# Patient Record
Sex: Male | Born: 1995 | Race: Black or African American | Hispanic: No | Marital: Single | Smoking: Never smoker
Health system: Southern US, Community
[De-identification: ages and names within clinical notes are randomized; demographics above are authoritative.]

## PROBLEM LIST (undated history)

## (undated) DIAGNOSIS — Z789 Other specified health status: Secondary | ICD-10-CM

## (undated) HISTORY — PX: NO PAST SURGERIES: SHX2092

---
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
## 2013-12-28 ENCOUNTER — Observation Stay (HOSPITAL_COMMUNITY)
Admission: EM | Admit: 2013-12-28 | Discharge: 2013-12-28 | Disposition: A | Discharge status: Home / Self Care | Payer: Self-pay | Attending: General Surgery | Admitting: General Surgery

## 2013-12-28 ENCOUNTER — Emergency Department (HOSPITAL_COMMUNITY): Payer: Self-pay

## 2013-12-28 ENCOUNTER — Observation Stay (HOSPITAL_COMMUNITY): Payer: Self-pay

## 2013-12-28 ENCOUNTER — Encounter (HOSPITAL_COMMUNITY): Payer: Self-pay | Admitting: Radiology

## 2013-12-28 DIAGNOSIS — S31809A Unspecified open wound of unspecified buttock, initial encounter: Principal | ICD-10-CM | POA: Insufficient documentation

## 2013-12-28 DIAGNOSIS — W3400XA Accidental discharge from unspecified firearms or gun, initial encounter: Secondary | ICD-10-CM

## 2013-12-28 DIAGNOSIS — S71109A Unspecified open wound, unspecified thigh, initial encounter: Secondary | ICD-10-CM | POA: Insufficient documentation

## 2013-12-28 DIAGNOSIS — S71009A Unspecified open wound, unspecified hip, initial encounter: Secondary | ICD-10-CM | POA: Insufficient documentation

## 2013-12-28 LAB — POCT I-STAT, CHEM 8
BUN: 14 mg/dL (ref 6–23)
CALCIUM ION: 1.18 mmol/L (ref 1.12–1.23)
CREATININE: 1.1 mg/dL (ref 0.50–1.35)
Chloride: 100 mEq/L (ref 96–112)
Glucose, Bld: 94 mg/dL (ref 70–99)
HCT: 49 % (ref 39.0–52.0)
HEMOGLOBIN: 16.7 g/dL (ref 13.0–17.0)
POTASSIUM: 3.9 meq/L (ref 3.7–5.3)
Sodium: 138 mEq/L (ref 137–147)
TCO2: 27 mmol/L (ref 0–100)

## 2013-12-28 LAB — COMPREHENSIVE METABOLIC PANEL
ALBUMIN: 4 g/dL (ref 3.5–5.2)
ALT: 12 U/L (ref 0–53)
AST: 25 U/L (ref 0–37)
Alkaline Phosphatase: 52 U/L (ref 39–117)
BUN: 13 mg/dL (ref 6–23)
CHLORIDE: 99 meq/L (ref 96–112)
CO2: 23 mEq/L (ref 19–32)
CREATININE: 0.96 mg/dL (ref 0.50–1.35)
Calcium: 9.1 mg/dL (ref 8.4–10.5)
GFR calc Af Amer: >90 mL/min (ref >90)
Glucose, Bld: 96 mg/dL (ref 70–99)
Potassium: 4.1 mEq/L (ref 3.7–5.3)
Sodium: 137 mEq/L (ref 137–147)
Total Bilirubin: 0.6 mg/dL (ref 0.3–1.2)
Total Protein: 7.6 g/dL (ref 6.0–8.3)

## 2013-12-28 LAB — PROTIME-INR
INR: 0.97 (ref 0.00–1.49)
PROTHROMBIN TIME: 12.7 s (ref 11.6–15.2)

## 2013-12-28 LAB — URINALYSIS, ROUTINE W REFLEX MICROSCOPIC
Bilirubin Urine: NEGATIVE
GLUCOSE, UA: NEGATIVE mg/dL
Hgb urine dipstick: NEGATIVE
Ketones, ur: NEGATIVE mg/dL
Leukocytes, UA: NEGATIVE
NITRITE: NEGATIVE
PROTEIN: NEGATIVE mg/dL
Specific Gravity, Urine: 1.046 — ABNORMAL HIGH (ref 1.005–1.030)
Urobilinogen, UA: 1 mg/dL (ref 0.0–1.0)
pH: 6.5 (ref 5.0–8.0)

## 2013-12-28 LAB — TYPE AND SCREEN
ABO/RH(D): A POS
Antibody Screen: NEGATIVE
UNIT DIVISION: 0
Unit division: 0

## 2013-12-28 LAB — CDS SEROLOGY

## 2013-12-28 LAB — CBC
HCT: 43 % (ref 39.0–52.0)
Hemoglobin: 14.4 g/dL (ref 13.0–17.0)
MCH: 23.5 pg — ABNORMAL LOW (ref 26.0–34.0)
MCHC: 33.5 g/dL (ref 30.0–36.0)
MCV: 70.3 fL — ABNORMAL LOW (ref 78.0–100.0)
PLATELETS: 205 10*3/uL (ref 150–400)
RBC: 6.12 MIL/uL — AB (ref 4.22–5.81)
RDW: 15.6 % — AB (ref 11.5–15.5)
WBC: 12.5 10*3/uL — AB (ref 4.0–10.5)

## 2013-12-28 LAB — CG4 I-STAT (LACTIC ACID): Lactic Acid, Venous: 3.42 mmol/L — ABNORMAL HIGH (ref 0.5–2.2)

## 2013-12-28 LAB — ABO/RH: ABO/RH(D): A POS

## 2013-12-28 MED ORDER — FENTANYL CITRATE 0.05 MG/ML IJ SOLN
50.0000 ug | Freq: Once | INTRAMUSCULAR | Status: AC
  Administered 2013-12-28: 50 ug via INTRAVENOUS

## 2013-12-28 MED ORDER — DEXTROSE 5 % IV SOLN
2.0000 g | Freq: Once | INTRAVENOUS | Status: AC
  Administered 2013-12-28: 2 g via INTRAVENOUS
  Filled 2013-12-28: qty 20

## 2013-12-28 MED ORDER — FENTANYL CITRATE 0.05 MG/ML IJ SOLN
INTRAMUSCULAR | Status: AC
  Administered 2013-12-28: 50 ug
  Filled 2013-12-28: qty 2

## 2013-12-28 MED ORDER — IOHEXOL 300 MG/ML  SOLN
100.0000 mL | Freq: Once | INTRAMUSCULAR | Status: AC | PRN
  Administered 2013-12-28: 100 mL via INTRAVENOUS
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
## 2013-12-28 NOTE — ED Notes (Signed)
Attempt made to obtain urine from patient. He states he will be able to urinate in a little bit.

## 2013-12-28 NOTE — ED Notes (Signed)
Pt.s belongings placed in brown paper bags, closed and dated.
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
## 2013-12-28 NOTE — ED Notes (Signed)
CSI at bedside.

## 2013-12-28 NOTE — ED Notes (Signed)
Pt. Transferred  To CT scan 2, accompanied by RN and Dr. [name]

## 2013-12-28 NOTE — ED Notes (Signed)
Paging Dr. [name], patient states he does not want to be admitted and he wants to go home.

## 2013-12-28 NOTE — Discharge Instructions (Signed)
Per Dr. [name] keep wounds dry and covered with gauze. If any complications or questions call the Trauma office at [phone].
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
## 2013-12-28 NOTE — ED Notes (Signed)
4x4's and 2x2's applied to patients wounds and secured with tape. Patient given crutches with instructions on how to use.

## 2013-12-28 NOTE — ED Notes (Signed)
Pt. Is aware of needing a urine specimen 
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
## 2013-12-28 NOTE — ED Notes (Signed)
Per Dr. [name] patient is able to be discharged home with crutches. Dr. [name] is on his way to see patient at this time.
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
## 2013-12-28 NOTE — ED Notes (Signed)
Patient states that he is considering on just getting some crutches and going home tonight. Patient states that Dr. [name] gave him this option.

## 2013-12-28 NOTE — ED Notes (Signed)
Family for pt continues to come to desk wanting to go back and see pt.  Family informed that [employer] states family must remain out of room until their work is finished.  Family accepts this explaination and politely waiting in lobby.

## 2013-12-28 NOTE — ED Notes (Signed)
Pt. Returned from CT scan.

## 2014-03-01 ENCOUNTER — Emergency Department (HOSPITAL_COMMUNITY): Payer: Self-pay

## 2014-03-01 ENCOUNTER — Emergency Department (HOSPITAL_COMMUNITY)
Admission: EM | Admit: 2014-03-01 | Discharge: 2014-03-01 | Discharge status: Against Medical Advice | Payer: Self-pay | Attending: Emergency Medicine | Admitting: Emergency Medicine

## 2014-03-01 ENCOUNTER — Encounter (HOSPITAL_COMMUNITY): Payer: Self-pay | Admitting: Emergency Medicine

## 2014-03-01 DIAGNOSIS — F1210 Cannabis abuse, uncomplicated: Secondary | ICD-10-CM | POA: Insufficient documentation

## 2014-03-01 DIAGNOSIS — R413 Other amnesia: Secondary | ICD-10-CM | POA: Insufficient documentation

## 2014-03-01 LAB — COMPREHENSIVE METABOLIC PANEL
ALBUMIN: 4 g/dL (ref 3.5–5.2)
ALT: 12 U/L (ref 0–53)
AST: 19 U/L (ref 0–37)
Alkaline Phosphatase: 55 U/L (ref 39–117)
BILIRUBIN TOTAL: 0.8 mg/dL (ref 0.3–1.2)
BUN: 9 mg/dL (ref 6–23)
CHLORIDE: 99 meq/L (ref 96–112)
CO2: 27 mEq/L (ref 19–32)
Calcium: 9.5 mg/dL (ref 8.4–10.5)
Creatinine, Ser: 0.91 mg/dL (ref 0.50–1.35)
GFR calc Af Amer: >90 mL/min (ref >90)
Glucose, Bld: 104 mg/dL — ABNORMAL HIGH (ref 70–99)
Potassium: 3.7 mEq/L (ref 3.7–5.3)
SODIUM: 140 meq/L (ref 137–147)
Total Protein: 7.5 g/dL (ref 6.0–8.3)

## 2014-03-01 LAB — RAPID URINE DRUG SCREEN, HOSP PERFORMED
Amphetamines: NOT DETECTED
BARBITURATES: NOT DETECTED
Benzodiazepines: NOT DETECTED
Cocaine: NOT DETECTED
Opiates: NOT DETECTED
TETRAHYDROCANNABINOL: POSITIVE — AB

## 2014-03-01 LAB — CBC
HCT: 44.9 % (ref 39.0–52.0)
Hemoglobin: 15 g/dL (ref 13.0–17.0)
MCH: 23.8 pg — ABNORMAL LOW (ref 26.0–34.0)
MCHC: 33.4 g/dL (ref 30.0–36.0)
MCV: 71.2 fL — ABNORMAL LOW (ref 78.0–100.0)
PLATELETS: 164 10*3/uL (ref 150–400)
RBC: 6.31 MIL/uL — ABNORMAL HIGH (ref 4.22–5.81)
RDW: 15.5 % (ref 11.5–15.5)
WBC: 8.6 10*3/uL (ref 4.0–10.5)

## 2014-03-01 LAB — CBG MONITORING, ED: Glucose-Capillary: 109 mg/dL — ABNORMAL HIGH (ref 70–99)

## 2014-03-01 LAB — ETHANOL

## 2014-03-01 LAB — AMMONIA: Ammonia: 24 umol/L (ref 11–60)

## 2014-03-01 NOTE — ED Notes (Signed)
PtA&Ox4,   Ambulatory at discharge, verbalizing no complaints at this time.
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
## 2014-03-01 NOTE — ED Notes (Signed)
Pts father approached Marlon Peliffany Greene, [place] and would like for patient to be discharged. Tiffany, PA advised patient's father that the patient would need to remain in his sight and care and [facility] and for the patient to remain under his care/sight until he takes the patient to behavioral health. Pts father agrees and states that the patient would stay at his house and be under his care until his [facility] and father states he would take the patient to behavioral health tomorrow morning and reports he has the resources [facility]. Tiffany, PA agrees to discharge the patient tonight and father agrees to have patient remain under his care until patient is taken for evaluation at behavioral health.
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
## 2014-03-02 ENCOUNTER — Ambulatory Visit (HOSPITAL_COMMUNITY)
Admission: RE | Admit: 2014-03-02 | Discharge: 2014-03-02 | Disposition: A | Discharge status: Home / Self Care | Payer: Self-pay | Attending: Psychiatry | Admitting: Psychiatry

## 2014-03-02 ENCOUNTER — Encounter (HOSPITAL_COMMUNITY): Payer: Self-pay | Admitting: *Deleted

## 2014-03-02 HISTORY — DX: Other specified health status: Z78.9

## 2014-03-02 LAB — CBG MONITORING, ED: GLUCOSE-CAPILLARY: 77 mg/dL (ref 70–99)
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
## 2014-03-02 NOTE — ED Provider Notes (Signed)
Medical screening examination/treatment/procedure(s) were conducted as a shared visit with non-physician practitioner(s) and myself.  I personally evaluated the patient during the encounter.  Patient states confusion and memory loss since this morning. Had to be told who is parents were.  Denies any trauma or drug use. No psych history. Alert and oriented x3 on my exam, no meningismus, no fever, no neuro deficits. No headache. No neck pain or stiffness. CT head negative except for mucocele.  THC positive. Not suicidal or homicidal.   Patient and family wished to leave before evaluation was complete. They think etiology is psych related which may be correct.  Patient is oriented x3, no SI or HI and appears to have capacity to leave against medical advice. Patient and family understand that life threatening conditions have not been completely ruled out and there is potential for possible deterioration and possible death.  They understand these risks and will leave AMA.   EKG Interpretation None       Jose OctaveStephen Ryatt Corsino, MD 03/02/14 1049

## 2014-10-23 ENCOUNTER — Encounter (HOSPITAL_COMMUNITY): Payer: Self-pay | Admitting: Emergency Medicine

## 2014-10-23 ENCOUNTER — Emergency Department (HOSPITAL_COMMUNITY): Payer: Self-pay

## 2014-10-23 ENCOUNTER — Emergency Department (HOSPITAL_COMMUNITY)
Admission: EM | Admit: 2014-10-23 | Discharge: 2014-10-23 | Disposition: A | Discharge status: Home / Self Care | Payer: Self-pay | Attending: Emergency Medicine | Admitting: Emergency Medicine

## 2014-10-23 DIAGNOSIS — T1490XA Injury, unspecified, initial encounter: Secondary | ICD-10-CM

## 2014-10-23 DIAGNOSIS — W2201XA Walked into wall, initial encounter: Secondary | ICD-10-CM | POA: Insufficient documentation

## 2014-10-23 DIAGNOSIS — Y9389 Activity, other specified: Secondary | ICD-10-CM | POA: Insufficient documentation

## 2014-10-23 DIAGNOSIS — S62613A Displaced fracture of proximal phalanx of left middle finger, initial encounter for closed fracture: Secondary | ICD-10-CM | POA: Insufficient documentation

## 2014-10-23 DIAGNOSIS — Y998 Other external cause status: Secondary | ICD-10-CM | POA: Insufficient documentation

## 2014-10-23 DIAGNOSIS — S62619A Displaced fracture of proximal phalanx of unspecified finger, initial encounter for closed fracture: Secondary | ICD-10-CM

## 2014-10-23 DIAGNOSIS — Y9289 Other specified places as the place of occurrence of the external cause: Secondary | ICD-10-CM | POA: Insufficient documentation

## 2014-10-23 MED ORDER — NAPROXEN 500 MG PO TABS: 500.0000 mg | ORAL_TABLET | Freq: Two times a day (BID) | ORAL | Status: DC

## 2014-10-23 MED ORDER — TRAMADOL HCL 50 MG PO TABS: 50.0000 mg | ORAL_TABLET | Freq: Four times a day (QID) | ORAL | Status: DC | PRN

## 2014-10-23 MED ORDER — OXYCODONE-ACETAMINOPHEN 5-325 MG PO TABS
1.0000 | ORAL_TABLET | Freq: Once | ORAL | Status: AC
  Administered 2014-10-23: 1 via ORAL
  Filled 2014-10-23: qty 1

## 2014-10-23 NOTE — ED Notes (Signed)
Splint applied by ortho tech

## 2014-10-23 NOTE — ED Notes (Signed)
Ortho tech paged  

## 2014-10-23 NOTE — Progress Notes (Signed)
Orthopedic Tech Progress Note Patient Details:  Michele McalpineKivaughn Ellsworth 11/07/1996 [id number] Applied fiberglass volar arm splint to LUE.  Pulses, sensation, motion intact before and after application.  Capillary refill less than 2 seconds before and after application. Ortho Devices Type of Ortho Device: Volar splint Ortho Device/Splint Location: LUE Ortho Device/Splint Interventions: Application   Lesle ChrisGilliland, Jisell Majer L 10/23/2014, 11:31 PM

## 2014-10-23 NOTE — ED Notes (Signed)
Ortho paged. 

## 2014-10-23 NOTE — ED Notes (Signed)
Ortho tech at the bedside.  

## 2014-10-23 NOTE — ED Notes (Signed)
Ortho tech coming to place slint
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
## 2014-10-23 NOTE — ED Notes (Signed)
Pt. punched a wall yesterday , presents with left hand pain / swelling .

## 2014-10-23 NOTE — Discharge Instructions (Signed)
Naprosyn for pain. Ultram for severe pain. Follow up with hand specialist as referred. Keep hand up elevated, ice.   Avulsion Fracture You have an avulsion fracture. Avulsion fractures are chips of bone pulled off by muscle tendons or ligaments. Common avulsion fractures are on the hand and foot. Avulsion fractures can also involve the elbow, knee, hip, and pelvis. The diagnosis is usually made by X-ray or ultrasound exam. These fractures may cause a deformity if the growth plate of the bone is involved in growing children. A growth plate is an area near the end of the bone where the bone grows from. Avulsion fractures can take several weeks to heal. They need long-term protection and follow-up. Do not remove the splint, immobilizer, or cast that has been applied to treat your injury unless instructed to do so. This is the most important part of your treatment. Other measures for treating avulsion fractures may include:  Keeping the injured limb at rest and elevated as recommended by your caregiver. This reduces pain and swelling. Use pillows to rest and elevate your arm or leg at night.  Ice packs applied to your injury every 20 minutes while awake for the next 2 days or as directed.  Pain medications. Avulsion fractures near joints may require rehabilitation. Rarely an avulsion fracture needs surgery to hold pieces together. Proper follow-up care is important. Call your caregiver for a follow-up appointment.  SEEK IMMEDIATE MEDICAL CARE IF:   You notice increasing pain or pressure in the injury.  The area becomes cold, numb, or pale. Document Released: 01/09/2005 Document Revised: 02/24/2012 Document Reviewed: 02/25/2014 [facility] Patient Information 2015 [place], [place]. This information is not intended to replace advice given to you by your health care provider. Make sure you discuss any questions you have with your health care provider.

## 2015-01-15 ENCOUNTER — Encounter (HOSPITAL_COMMUNITY): Payer: Self-pay | Admitting: Cardiology

## 2015-01-15 ENCOUNTER — Emergency Department (HOSPITAL_COMMUNITY)
Admission: EM | Admit: 2015-01-15 | Discharge: 2015-01-15 | Disposition: A | Discharge status: Home / Self Care | Payer: Self-pay | Attending: Emergency Medicine | Admitting: Emergency Medicine

## 2015-01-15 DIAGNOSIS — J020 Streptococcal pharyngitis: Secondary | ICD-10-CM | POA: Insufficient documentation

## 2015-01-15 DIAGNOSIS — Z791 Long term (current) use of non-steroidal anti-inflammatories (NSAID): Secondary | ICD-10-CM | POA: Insufficient documentation

## 2015-01-15 DIAGNOSIS — R110 Nausea: Secondary | ICD-10-CM | POA: Insufficient documentation

## 2015-01-15 LAB — RAPID STREP SCREEN (MED CTR MEBANE ONLY): Streptococcus, Group A Screen (Direct): POSITIVE — AB

## 2015-01-15 MED ORDER — KETOROLAC TROMETHAMINE 60 MG/2ML IM SOLN
60.0000 mg | Freq: Once | INTRAMUSCULAR | Status: AC
  Administered 2015-01-15: 60 mg via INTRAMUSCULAR
  Filled 2015-01-15: qty 2

## 2015-01-15 MED ORDER — ACETAMINOPHEN 500 MG PO TABS
1000.0000 mg | ORAL_TABLET | Freq: Once | ORAL | Status: AC
  Administered 2015-01-15: 1000 mg via ORAL
  Filled 2015-01-15: qty 2

## 2015-01-15 MED ORDER — PENICILLIN G BENZATHINE 1200000 UNIT/2ML IM SUSP
1.2000 10*6.[IU] | Freq: Once | INTRAMUSCULAR | Status: AC
  Administered 2015-01-15: 1.2 10*6.[IU] via INTRAMUSCULAR
  Filled 2015-01-15: qty 2

## 2015-01-15 MED ORDER — NAPROXEN 500 MG PO TABS: 500.0000 mg | ORAL_TABLET | Freq: Two times a day (BID) | ORAL | Status: DC

## 2015-01-15 NOTE — ED Provider Notes (Signed)
CSN: [id number]     Arrival date & time 01/15/15  1117 History   First MD Initiated Contact with Patient 01/15/15 1316     Chief Complaint  Patient presents with  . Sore Throat  . Headache     (Consider location/radiation/quality/duration/timing/severity/associated sxs/prior Treatment) HPI Comments: Patient presents with complaint of sore throat yesterday and then muscle aches, fatigue, fever and chills starting this morning. Patient has nausea but no vomiting. No chest pain or cough. No treatments prior to arrival. No known sick contacts.  Patient is a 19 y.o. male presenting with pharyngitis and headaches. The history is provided by the patient.  Sore Throat Associated symptoms include chills, fatigue, a fever, headaches, nausea and a sore throat. Pertinent negatives include no abdominal pain, congestion, coughing, myalgias, rash or vomiting.  Headache Associated symptoms: fatigue, fever, nausea and sore throat   Associated symptoms: no abdominal pain, no congestion, no cough, no diarrhea, no ear pain, no myalgias, no neck stiffness, no sinus pressure and no vomiting     Past Medical History  Diagnosis Date  . Medical history non-contributory    Past Surgical History  Procedure Laterality Date  . No past surgeries     History reviewed. No pertinent family history. History  Substance Use Topics  . Smoking status: Never Smoker   . Smokeless tobacco: Never Used  . Alcohol Use: Yes    Review of Systems  Constitutional: Positive for fever, chills and fatigue.  HENT: Positive for sore throat. Negative for congestion, ear pain, rhinorrhea and sinus pressure.   Eyes: Negative for redness.  Respiratory: Negative for cough and wheezing.   Gastrointestinal: Positive for nausea. Negative for vomiting, abdominal pain and diarrhea.  Genitourinary: Negative for dysuria.  Musculoskeletal: Negative for myalgias and neck stiffness.  Skin: Negative for rash.  Neurological: Positive for  headaches.  Hematological: Negative for adenopathy.      Allergies  Shrimp  Home Medications   Prior to Admission medications   Medication Sig Start Date End Date Taking? Authorizing Provider  naproxen (NAPROSYN) 500 MG tablet Take 1 tablet (500 mg total) by mouth 2 (two) times daily. 10/23/14   Tatyana A Kirichenko, PA-C  traMADol (ULTRAM) 50 MG tablet Take 1 tablet (50 mg total) by mouth every 6 (six) hours as needed. 10/23/14   Tatyana A Kirichenko, PA-C   BP 138/68 mmHg  Pulse 91  Temp(Src) 102.3 F (39.1 C) (Oral)  Resp 14  SpO2 98% Physical Exam  Constitutional: He appears well-developed and well-nourished.  HENT:  Head: Normocephalic and atraumatic.  Right Ear: Tympanic membrane, external ear and ear canal normal.  Left Ear: Tympanic membrane, external ear and ear canal normal.  Nose: Nose normal. No mucosal edema or rhinorrhea.  Mouth/Throat: Uvula is midline and mucous membranes are normal. Mucous membranes are not dry. No trismus in the jaw. No uvula swelling. Posterior oropharyngeal edema and posterior oropharyngeal erythema present. No oropharyngeal exudate or tonsillar abscesses.  Eyes: Conjunctivae are normal. Right eye exhibits no discharge. Left eye exhibits no discharge.  Neck: Normal range of motion. Neck supple.  No meningismus.  Cardiovascular: Normal rate, regular rhythm and normal heart sounds.   No murmur heard. Pulmonary/Chest: Effort normal and breath sounds normal. No respiratory distress. He has no wheezes. He has no rales.  Abdominal: Soft. There is no tenderness.  Lymphadenopathy:    He has cervical adenopathy.  Neurological: He is alert.  Skin: Skin is warm and dry.  Psychiatric: He has a normal mood  and affect.  Nursing note and vitals reviewed.   ED Course  Procedures (including critical care time) Labs Review Labs Reviewed  RAPID STREP SCREEN - Abnormal; Notable for the following:    Streptococcus, Group A Screen (Direct) POSITIVE (*)     All other components within normal limits    Imaging Review No results found.   EKG Interpretation None       1:32 PM Patient seen and examined. Informed of positive strep pharyngitis resolved. Medications ordered. Will give IM Bicillin, IM Toradol, by mouth Tylenol. DC to home on NSAIDs and hydration.  Vital signs reviewed and are as follows: BP 138/68 mmHg  Pulse 91  Temp(Src) 102.3 F (39.1 C) (Oral)  Resp 14  SpO2 98%  Patient urged to return with worsening symptoms or other concerns. Patient verbalized understanding and agrees with plan.   MDM   Final diagnoses:  Streptococcal pharyngitis   Strep screen positive. Antibiotics not prescribed. Patient nontoxic in appearance. No airway compromise. No indication of PTA, retropharyngeal abscess. No concern for meningitis.   Renne CriglerJoshua Kathleen Tamm, PA-C 01/15/15 1334  Gerhard Munchobert Lockwood, MD 01/15/15 [id number]

## 2015-01-15 NOTE — ED Notes (Addendum)
Pt reports he has had a sore throat and headache since this morning. Reports he feels weak all over. Asked pt if he would ben able to take tylenol and he stated his throat hurt to bad.

## 2015-01-15 NOTE — ED Notes (Signed)
Declined W/C at D/C and was escorted to lobby by RN. 

## 2015-01-15 NOTE — Discharge Instructions (Signed)
Please read and follow all provided instructions.  Your diagnoses today include:  1. Streptococcal pharyngitis     Tests performed today include:  Strep test: was POSITIVE for strep throat  Vital signs. See below for your results today.   Medications prescribed:  You were given a one-time shot of penicillin to treat your strep throat.    Naproxen - anti-inflammatory pain medication  Do not exceed 500mg  naproxen every 12 hours, take with food  You have been prescribed an anti-inflammatory medication or NSAID. Take with food. Take smallest effective dose for the shortest duration needed for your pain. Stop taking if you experience stomach pain or vomiting.   Take any medications prescribed only as directed.   Home care instructions:  Please read the educational materials provided and follow any instructions contained in this packet.  Follow-up instructions: Please follow-up with your primary care provider as needed for further evaluation of your symptoms.  Return instructions:   Please return to the Emergency Department if you experience worsening symptoms.   Return if you have worsening problems swallowing, your neck becomes swollen, you cannot swallow your saliva or your voice becomes muffled.   Return with high persistent fever, persistent vomiting, or if you have trouble breathing.   Please return if you have any other emergent concerns.  Additional Information:  Your vital signs today were: BP 138/68 mmHg   Pulse 91   Temp(Src) 102.3 F (39.1 C) (Oral)   Resp 14   SpO2 98% If your blood pressure (BP) was elevated above 135/85 this visit, please have this repeated by your doctor within one month. --------------

## 2015-08-05 ENCOUNTER — Emergency Department (HOSPITAL_COMMUNITY)
Admission: EM | Admit: 2015-08-05 | Discharge: 2015-08-05 | Disposition: A | Discharge status: Home / Self Care | Payer: Self-pay | Attending: Emergency Medicine | Admitting: Emergency Medicine

## 2015-08-05 ENCOUNTER — Emergency Department (HOSPITAL_COMMUNITY): Payer: Self-pay

## 2015-08-05 DIAGNOSIS — J159 Unspecified bacterial pneumonia: Secondary | ICD-10-CM | POA: Insufficient documentation

## 2015-08-05 DIAGNOSIS — K0381 Cracked tooth: Secondary | ICD-10-CM | POA: Insufficient documentation

## 2015-08-05 DIAGNOSIS — Z791 Long term (current) use of non-steroidal anti-inflammatories (NSAID): Secondary | ICD-10-CM | POA: Insufficient documentation

## 2015-08-05 DIAGNOSIS — K088 Other specified disorders of teeth and supporting structures: Secondary | ICD-10-CM | POA: Insufficient documentation

## 2015-08-05 DIAGNOSIS — J189 Pneumonia, unspecified organism: Secondary | ICD-10-CM

## 2015-08-05 DIAGNOSIS — K0889 Other specified disorders of teeth and supporting structures: Secondary | ICD-10-CM

## 2015-08-05 MED ORDER — AZITHROMYCIN 250 MG PO TABS
500.0000 mg | ORAL_TABLET | Freq: Once | ORAL | Status: AC
  Administered 2015-08-05: 500 mg via ORAL
  Filled 2015-08-05: qty 2

## 2015-08-05 MED ORDER — AZITHROMYCIN 250 MG PO TABS: 250.0000 mg | ORAL_TABLET | Freq: Every day | ORAL | Status: DC

## 2015-08-05 NOTE — Discharge Instructions (Signed)
Please read and follow all provided instructions.  Your diagnoses today include:  1. Community acquired pneumonia   2. Pain, dental     Tests performed today include:  Chest x-ray -- shows pneumonia  Vital signs. See below for your results today.   Medications prescribed:   Azithromycin - antibiotic for respiratory infection  You have been prescribed an antibiotic medicine: take the entire course of medicine even if you are feeling better. Stopping early can cause the antibiotic not to work.  Take any prescribed medications only as directed.  Home care instructions:  Follow any educational materials contained in this packet.  Take the complete course of antibiotics that you were prescribed.   BE VERY CAREFUL not to take multiple medicines containing Tylenol (also called acetaminophen). Doing so can lead to an overdose which can damage your liver and cause liver failure and possibly death.   Follow-up instructions: Please follow-up with your primary care provider in the next 3 days for further evaluation of your symptoms and to ensure resolution of your infection.   Return instructions:   Please return to the Emergency Department if you experience worsening symptoms.   Return immediately with worsening breathing, worsening shortness of breath, or if you feel it is taking you more effort to breathe.   Return with facial swelling or fever.   Please return if you have any other emergent concerns.  Additional Information:  Your vital signs today were: BP 125/70 mmHg   Pulse 77   Temp(Src) 99.1 F (37.3 C) (Oral)   Resp 20   Ht  (1.727 m)   Wt 150 lb (68.04 kg)   BMI 22.81 kg/m2   SpO2 98% If your blood pressure (BP) was elevated above 135/85 this visit, please have this repeated by your doctor within one month. --------------

## 2015-08-05 NOTE — ED Notes (Signed)
Onset 2 days ago productive cough yellow green mucous. States when coughing has drainage left upper wisdom tooth tan blood tingle pink. Airway intact bilateral equal chest rise and fall.

## 2015-08-05 NOTE — ED Provider Notes (Signed)
CSN: [id number]     Arrival date & time 08/05/15  1255 History  This chart was scribed for non-physician practitioner, Renne Crigler, PA-C working with Glynn Octave, MD by Angelene Giovanni, ED Scribe. The patient was seen in room TR02C/TR02C and the patient's care was started at 2:00 PM     Chief Complaint  Patient presents with  . Dental Problem  . Cough   The history is provided by the patient. No language interpreter was used.   HPI Comments: Jose Cisneros is a 19 y.o. male who presents to the Emergency Department complaining of cough onset 2 days ago. He reports associated coughing up of mucous, right sided CP, and coughing when he takes a deep breath. He denies facial swelling. He reports NKDA. No fever, N/V/D. No SOB. No history of blood clots or IVD. No treatments PTA.   He also reports dental drainage onset a couple of months ago. He explains that on average the drainage is brown, sometimes, it's green or bloody. No facial swelling, trouble breathing, trouble swallowing.   Past Medical History  Diagnosis Date  . Medical history non-contributory    Past Surgical History  Procedure Laterality Date  . No past surgeries     No family history on file. Social History  Substance Use Topics  . Smoking status: Never Smoker   . Smokeless tobacco: Never Used  . Alcohol Use: Yes    Review of Systems  Constitutional: Negative for fever.  HENT: Positive for dental problem. Negative for facial swelling, rhinorrhea and sore throat.   Eyes: Negative for redness.  Respiratory: Positive for cough. Negative for shortness of breath and wheezing.   Cardiovascular: Positive for chest pain. Negative for leg swelling.  Gastrointestinal: Negative for nausea, vomiting, abdominal pain and diarrhea.  Genitourinary: Negative for dysuria.  Musculoskeletal: Negative for myalgias.  Skin: Negative for rash.  Neurological: Negative for headaches.      Allergies  Shrimp  Home Medications    Prior to Admission medications   Medication Sig Start Date End Date Taking? Authorizing Provider  naproxen (NAPROSYN) 500 MG tablet Take 1 tablet (500 mg total) by mouth 2 (two) times daily. 01/15/15   Renne Crigler, PA-C  traMADol (ULTRAM) 50 MG tablet Take 1 tablet (50 mg total) by mouth every 6 (six) hours as needed. 10/23/14   Tatyana Kirichenko, PA-C   BP 125/70 mmHg  Pulse 77  Temp(Src) 99.1 F (37.3 C) (Oral)  Resp 20  Ht 5\' 8"  (1.727 m)  Wt 150 lb (68.04 kg)  BMI 22.81 kg/m2  SpO2 98% Physical Exam  Constitutional: He is oriented to person, place, and time. He appears well-developed and well-nourished. No distress.  HENT:  Head: Normocephalic and atraumatic.  Patient with broken left maxillary molar (tooth #16). There is some erythema posterior to this with mild ulceration. No active drainage. No palpable or gross abscess. No other oral lesions noted. Pharynx is normal-appearing.  Eyes: Conjunctivae and EOM are normal. Right eye exhibits no discharge. Left eye exhibits no discharge.  Neck: Normal range of motion. Neck supple. No tracheal deviation present.  Cardiovascular: Normal rate, regular rhythm and normal heart sounds.   Pulmonary/Chest: Effort normal and breath sounds normal. No respiratory distress. He has no wheezes. He has no rales.  Abdominal: Soft. There is no tenderness.  Musculoskeletal: Normal range of motion.  Neurological: He is alert and oriented to person, place, and time.  Skin: Skin is warm and dry.  Psychiatric: He has a normal mood  and affect. His behavior is normal.  Nursing note and vitals reviewed.   ED Course  Procedures (including critical care time) DIAGNOSTIC STUDIES: Oxygen Saturation is 98% on RA, normal by my interpretation.    COORDINATION OF CARE: 2:04 PM- Pt advised of plan for treatment and pt agrees.    Labs Review Labs Reviewed - No data to display  Imaging Review Dg Chest 2 View  08/05/2015   CLINICAL DATA:  Two day history  of cough and right-sided chest pain. Current smoker.  EXAM: CHEST  2 VIEW  COMPARISON:  03/01/2014.  FINDINGS: Airspace consolidation in the anterior segment of the right lower lobe with silhouetting of the central portion of the right hemidiaphragm. Lungs otherwise clear. Bronchovascular markings normal. Pulmonary vascularity normal. No pleural effusions. Cardiomediastinal silhouette unremarkable, unchanged. Visualized bony thorax intact.  IMPRESSION: Acute right lower lobe pneumonia.   Electronically Signed   By: Hulan Saas M.D.   On: 08/05/2015 13:48   I have personally reviewed and evaluated these images and lab results as part of my medical decision-making.   EKG Interpretation None      Patient seen and examined. Medications ordered.   Vital signs reviewed and are as follows: BP 125/70 mmHg  Pulse 77  Temp(Src) 99.1 F (37.3 C) (Oral)  Resp 20  Ht  (1.727 m)  Wt 150 lb (68.04 kg)  BMI 22.81 kg/m2  SpO2 98%  Patient informed of x-ray results. Azithromycin given here. Encouraged to follow-up with dentist regarding his broken tooth. He is to return with facial swelling or fever. Patient also encouraged to return with worsening chest pain, shortness of breath, high persistent fever, or other concerns.  MDM   Final diagnoses:  Community acquired pneumonia  Pain, dental   Community-acquired pneumonia: Patient appears well, is not tachycardic, febrile. No hypoxia. No risk factors for PE. Will treat with azithromycin.  Dental drainage: There is an irritated area behind the broken molar. No active drainage at this time.  Azithromycin may help. No gross abscess or need for drainage at this time. No deep space infection suspected.  I personally performed the services described in this documentation, which was scribed in my presence. The recorded information has been reviewed and is accurate.    Renne Crigler, PA-C 08/05/15 1453  Glynn Octave, MD 08/05/15 [phone]

## 2015-08-28 ENCOUNTER — Encounter (HOSPITAL_COMMUNITY): Payer: Self-pay | Admitting: Emergency Medicine

## 2015-08-28 ENCOUNTER — Emergency Department (HOSPITAL_COMMUNITY)
Admission: EM | Admit: 2015-08-28 | Discharge: 2015-08-28 | Disposition: A | Discharge status: Home / Self Care | Payer: Self-pay | Attending: Emergency Medicine | Admitting: Emergency Medicine

## 2015-08-28 ENCOUNTER — Emergency Department (HOSPITAL_COMMUNITY): Payer: Self-pay

## 2015-08-28 DIAGNOSIS — S62308A Unspecified fracture of other metacarpal bone, initial encounter for closed fracture: Secondary | ICD-10-CM

## 2015-08-28 DIAGNOSIS — Y939 Activity, unspecified: Secondary | ICD-10-CM | POA: Insufficient documentation

## 2015-08-28 DIAGNOSIS — S62396A Other fracture of fifth metacarpal bone, right hand, initial encounter for closed fracture: Secondary | ICD-10-CM | POA: Insufficient documentation

## 2015-08-28 DIAGNOSIS — Z792 Long term (current) use of antibiotics: Secondary | ICD-10-CM | POA: Insufficient documentation

## 2015-08-28 DIAGNOSIS — Z791 Long term (current) use of non-steroidal anti-inflammatories (NSAID): Secondary | ICD-10-CM | POA: Insufficient documentation

## 2015-08-28 DIAGNOSIS — Y929 Unspecified place or not applicable: Secondary | ICD-10-CM | POA: Insufficient documentation

## 2015-08-28 DIAGNOSIS — Y999 Unspecified external cause status: Secondary | ICD-10-CM | POA: Insufficient documentation

## 2015-08-28 DIAGNOSIS — W2209XA Striking against other stationary object, initial encounter: Secondary | ICD-10-CM | POA: Insufficient documentation

## 2015-08-28 MED ORDER — IBUPROFEN 800 MG PO TABS: 800.0000 mg | ORAL_TABLET | Freq: Three times a day (TID) | ORAL | Status: AC | PRN

## 2015-08-28 NOTE — Progress Notes (Signed)
Orthopedic Tech Progress Note Patient Details:  Jose Cisneros Aug 13, 1996 [id number]  Ortho Devices Type of Ortho Device: Ace wrap, Ulna gutter splint Ortho Device/Splint Location: RUE Ortho Device/Splint Interventions: Ordered, Application   Jennye Moccasin 08/28/2015, 9:48 PM

## 2015-08-28 NOTE — ED Notes (Signed)
Pt. punched a wall 2 days ago , presents with right hand pain and swelling .

## 2015-08-28 NOTE — Discharge Instructions (Signed)
Read the information below.  Use the prescribed medication as directed.  Please discuss all new medications with your pharmacist.  You may return to the Emergency Department at any time for worsening condition or any new symptoms that concern you.  If you develop uncontrolled pain, weakness or numbness of the extremity, severe discoloration of the skin, or you are unable to move your fingers, return to the ER for a recheck.      Boxer's Fracture You have a break (fracture) of the fifth metacarpal bone. This is commonly called a boxer's fracture. This is the bone in the hand where the little finger attaches. The fracture is in the end of that bone, closest to the little finger. It is usually caused when you hit an object with a clenched fist. Often, the knuckle is pushed down by the impact. Sometimes, the fracture rotates out of position. A boxer's fracture will usually heal within 6 weeks, if it is treated properly and protected from re-injury. Surgery is sometimes needed. A cast, splint, or bulky hand dressing may be used to protect and immobilize a boxer's fracture. Do not remove this device or dressing until your caregiver approves. Keep your hand elevated, and apply ice packs for 15-20 minutes every 2 hours, for the first 2 days. Elevation and ice help reduce swelling and relieve pain. See your caregiver, or an orthopedic specialist, for follow-up care within the next 10 days. This is to make sure your fracture is healing properly. Document Released: 12/02/2005 Document Revised: 02/24/2012 Document Reviewed: 05/22/2007 [facility] Patient Information 2015 [place], [place]. This information is not intended to replace advice given to you by your health care provider. Make sure you discuss any questions you have with your health care provider.  Cast or Splint Care Casts and splints support injured limbs and keep bones from moving while they heal. It is important to care for your cast or splint at home.   HOME CARE INSTRUCTIONS  Keep the cast or splint uncovered during the drying period. It can take 24 to 48 hours to dry if it is made of plaster. A fiberglass cast will dry in less than 1 hour.  Do not rest the cast on anything harder than a pillow for the first 24 hours.  Do not put weight on your injured limb or apply pressure to the cast until your health care provider gives you permission.  Keep the cast or splint dry. Wet casts or splints can lose their shape and may not support the limb as well. A wet cast that has lost its shape can also create harmful pressure on your skin when it dries. Also, wet skin can become infected.  Cover the cast or splint with a plastic bag when bathing or when out in the rain or snow. If the cast is on the trunk of the body, take sponge baths until the cast is removed.  If your cast does become wet, dry it with a towel or a blow dryer on the cool setting only.  Keep your cast or splint clean. Soiled casts may be wiped with a moistened cloth.  Do not place any hard or soft foreign objects under your cast or splint, such as cotton, toilet paper, lotion, or powder.  Do not try to scratch the skin under the cast with any object. The object could get stuck inside the cast. Also, scratching could lead to an infection. If itching is a problem, use a blow dryer on a cool setting to  relieve discomfort.  Do not trim or cut your cast or remove padding from inside of it.  Exercise all joints next to the injury that are not immobilized by the cast or splint. For example, if you have a long leg cast, exercise the hip joint and toes. If you have an arm cast or splint, exercise the shoulder, elbow, thumb, and fingers.  Elevate your injured arm or leg on 1 or 2 pillows for the first 1 to 3 days to decrease swelling and pain.It is best if you can comfortably elevate your cast so it is higher than your heart. SEEK MEDICAL CARE IF:   Your cast or splint cracks.  Your  cast or splint is too tight or too loose.  You have unbearable itching inside the cast.  Your cast becomes wet or develops a soft spot or area.  You have a bad smell coming from inside your cast.  You get an object stuck under your cast.  Your skin around the cast becomes red or raw.  You have new pain or worsening pain after the cast has been applied. SEEK IMMEDIATE MEDICAL CARE IF:   You have fluid leaking through the cast.  You are unable to move your fingers or toes.  You have discolored (blue or white), cool, painful, or very swollen fingers or toes beyond the cast.  You have tingling or numbness around the injured area.  You have severe pain or pressure under the cast.  You have any difficulty with your breathing or have shortness of breath.  You have chest pain. Document Released: 11/29/2000 Document Revised: 09/22/2013 Document Reviewed: 06/10/2013 [facility] Patient Information 2015 [place], [place]. This information is not intended to replace advice given to you by your health care provider. Make sure you discuss any questions you have with your health care provider.

## 2015-08-28 NOTE — ED Provider Notes (Signed)
CSN: [id number]     Arrival date & time 08/28/15  1951 History  This chart was scribed for Trixie Dredge, PA-C, working with Vanetta Mulders, MD by Elon Spanner, ED Scribe. This patient was seen in room TR11C/TR11C and the patient's care was started at 9:10 PM.   Chief Complaint  Patient presents with  . Hand Injury   The history is provided by the patient. No language interpreter was used.   HPI Comments: Jose Cisneros is a 19 y.o. right-handed male who presents to the Emergency Department complaining of moderate, constant pain, swelling, and numbness in the 4th and 5th metacarpal onset two days ago after punching a wall due to anger  There is also an intermittent radiation of pain into the the wrist.  He reports difficulty flexing/extending his right hand due to pain.  He denies other injury.  Denies hitting a person or possibility of a fight bite.   Past Medical History  Diagnosis Date  . Medical history non-contributory    Past Surgical History  Procedure Laterality Date  . No past surgeries     No family history on file. Social History  Substance Use Topics  . Smoking status: Never Smoker   . Smokeless tobacco: Never Used  . Alcohol Use: Yes    Review of Systems  Constitutional: Negative for fever.  Musculoskeletal: Positive for joint swelling and arthralgias.  Skin: Negative for color change and wound.  Allergic/Immunologic: Negative for immunocompromised state.  Neurological: Positive for numbness. Negative for weakness.  Hematological: Does not bruise/bleed easily.  Psychiatric/Behavioral: Positive for self-injury.      Allergies  Shrimp  Home Medications   Prior to Admission medications   Medication Sig Start Date End Date Taking? Authorizing Provider  azithromycin (ZITHROMAX) 250 MG tablet Take 1 tablet (250 mg total) by mouth daily. 08/05/15   Renne Crigler, PA-C  naproxen (NAPROSYN) 500 MG tablet Take 1 tablet (500 mg total) by mouth 2 (two) times daily.  01/15/15   Renne Crigler, PA-C  traMADol (ULTRAM) 50 MG tablet Take 1 tablet (50 mg total) by mouth every 6 (six) hours as needed. 10/23/14   Tatyana Kirichenko, PA-C   BP 127/72 mmHg  Pulse 80  Temp(Src) 98.6 F (37 C) (Oral)  Resp 14  SpO2 99% Physical Exam  Constitutional: He appears well-developed and well-nourished. No distress.  HENT:  Head: Normocephalic and atraumatic.  Neck: Neck supple.  Pulmonary/Chest: Effort normal.  Musculoskeletal:  RUE:  Right hand with full AROM of all digits with slower movement of 5th digits.  Sensation intact but with altered sensation of right 5th finger.  Capillary refill intact throughout.  No break in skin.  Diffuse edema and tenderness over 4th and 5th metacarpals.  No tenderness of the right wrist, forearm, or elbow.    Neurological: He is alert. He exhibits normal muscle tone.  Skin: He is not diaphoretic.  Psychiatric: He has a normal mood and affect. His behavior is normal.  Nursing note and vitals reviewed.   ED Course  Procedures (including critical care time)  DIAGNOSTIC STUDIES: Oxygen Saturation is 99% on RA, normal by my interpretation.    COORDINATION OF CARE:  9:21 PM Discussed treatment plan with patient at bedside.  Patient acknowledges and agrees with plan.    Labs Review Labs Reviewed - No data to display  Imaging Review Dg Hand Complete Right  08/28/2015   CLINICAL DATA:  Pain and swelling in the right hand fifth metacarpal after hitting a wall  2 days ago.  EXAM: RIGHT HAND - COMPLETE 3+ VIEW  COMPARISON:  None.  FINDINGS: There is a transverse fracture associated with anterior angulation of the mid fifth metacarpal. Associated soft tissue swelling. No other acute fractures or subluxation.  IMPRESSION: 1. Fifth metacarpal fracture. 2. Significant soft tissue swelling.   Electronically Signed   By: Norva Pavlov M.D.   On: 08/28/2015 21:47   I have personally reviewed and evaluated these images and lab results as part  of my medical decision-making.   EKG Interpretation None      MDM   Final diagnoses:  Closed fracture of 5th metacarpal, initial encounter    Afebrile, nontoxic patient with injury to his right hand while punching a wall 2 days ago.  Skin intact.     Xray demonstrates 5th metacarpal fracture.    D/C home with splint, ibuprofen, hand surgery follow up.  Discussed result, findings, treatment, and follow up  with patient.  Pt given return precautions.  Pt verbalizes understanding and agrees with plan.       I personally performed the services described in this documentation, which was scribed in my presence. The recorded information has been reviewed and is accurate.    Trixie Dredge, PA-C 08/28/15 2219  Vanetta Mulders, MD 08/30/15 1700

## 2016-01-09 ENCOUNTER — Encounter (HOSPITAL_COMMUNITY): Payer: Self-pay | Admitting: Emergency Medicine

## 2016-01-09 ENCOUNTER — Emergency Department (HOSPITAL_COMMUNITY)
Admission: EM | Admit: 2016-01-09 | Discharge: 2016-01-09 | Disposition: A | Discharge status: Home / Self Care | Payer: Self-pay | Attending: Emergency Medicine | Admitting: Emergency Medicine

## 2016-01-09 DIAGNOSIS — J029 Acute pharyngitis, unspecified: Secondary | ICD-10-CM | POA: Insufficient documentation

## 2016-01-09 DIAGNOSIS — K0889 Other specified disorders of teeth and supporting structures: Secondary | ICD-10-CM | POA: Insufficient documentation

## 2016-01-09 LAB — RAPID STREP SCREEN (MED CTR MEBANE ONLY): STREPTOCOCCUS, GROUP A SCREEN (DIRECT): NEGATIVE

## 2016-01-09 MED ORDER — ACETAMINOPHEN 160 MG/5ML PO SOLN
650.0000 mg | Freq: Once | ORAL | Status: AC
  Administered 2016-01-09: 650 mg via ORAL
  Filled 2016-01-09: qty 20.3

## 2016-01-09 MED ORDER — DEXAMETHASONE SODIUM PHOSPHATE 10 MG/ML IJ SOLN
10.0000 mg | Freq: Once | INTRAMUSCULAR | Status: AC
  Administered 2016-01-09: 10 mg via INTRAMUSCULAR
  Filled 2016-01-09: qty 1

## 2016-01-09 MED ORDER — AMOXICILLIN 400 MG/5ML PO SUSR: 1000.0000 mg | Freq: Every day | ORAL | Status: AC

## 2016-01-09 NOTE — Discharge Instructions (Signed)
Return straight to ED for any difficulty breathing, shortness of breath, trouble swallowing, new or worsening symptoms, any additional concerns. Please take all of your antibiotics until finished!

## 2016-01-09 NOTE — ED Provider Notes (Signed)
CSN: [id number]     Arrival date & time 01/09/16  2103 History  By signing my name below, I, Jose Cisneros, attest that this documentation has been prepared under the direction and in the presence of non-physician practitioner, Elizabeth Sauer, PA-C. Electronically Signed: Linna Cisneros, Scribe. 01/09/2016. 10:14 PM.    Chief Complaint  Patient presents with  . Dental Pain  . Sore Throat    The history is provided by the patient. No language interpreter was used.     HPI Comments: Jose Cisneros is a 20 y.o. male with h/o of multiple strep + pharyngitis episodes who presents to the Emergency Department complaining of severe, gradual onset, constant, worsening, left sided sore throat and swelling rated 10/10 that began last night. He reports associated dry but painful cough. Pt also reports associated fever, chills. Pt reports that he has had difficulty swallowing pills for his sore throat. He also reports associated pain in his left ear. Pt denies SOB or any other associated symptoms at this time.   Past Medical History  Diagnosis Date  . Medical history non-contributory    Past Surgical History  Procedure Laterality Date  . No past surgeries     No family history on file. Social History  Substance Use Topics  . Smoking status: Never Smoker   . Smokeless tobacco: Never Used  . Alcohol Use: Yes    Review of Systems  Constitutional: Positive for fever and chills.  HENT: Positive for ear pain (left), sore throat and trouble swallowing.   Respiratory: Positive for cough. Negative for shortness of breath.     Allergies  Shrimp  Home Medications   Prior to Admission medications   Medication Sig Start Date End Date Taking? Authorizing Provider  ibuprofen (ADVIL,MOTRIN) 800 MG tablet Take 1 tablet (800 mg total) by mouth every 8 (eight) hours as needed for mild pain or moderate pain. 08/28/15  Yes Trixie Dredge, PA-C  amoxicillin (AMOXIL) 400 MG/5ML suspension Take 12.5 mLs (1,000 mg  total) by mouth daily. X 10 days 01/09/16 01/16/16  Chase Picket Ward, PA-C   BP 104/55 mmHg  Pulse 80  Temp(Src) 98.5 F (36.9 C) (Oral)  Resp 16  Ht  (1.727 m)  Wt 65.772 kg  BMI 22.05 kg/m2  SpO2 100% Physical Exam  Constitutional: He is oriented to person, place, and time. He appears well-developed and well-nourished. No distress.  HENT:  Head: Normocephalic and atraumatic.  Mouth/Throat: Uvula is midline. Normal dentition. No dental abscesses, uvula swelling or dental caries. Oropharyngeal exudate (left) and posterior oropharyngeal edema (left) present.  Airway patent Left tonsil with exudates and edema Uvula midline, no drooling, no voice change No TTP of gums or dentition, no abscess noted, neck supple and no tenderness other than left lymph node. No facial edema  Eyes: Conjunctivae and EOM are normal.  Neck: Neck supple. No tracheal deviation present.  Tender left cervical adenopathy  Cardiovascular: Normal rate.   Pulmonary/Chest: Effort normal and breath sounds normal. No respiratory distress. He has no wheezes. He has no rales.  Musculoskeletal: Normal range of motion.  Lymphadenopathy:    He has cervical adenopathy (tender, left).  Neurological: He is alert and oriented to person, place, and time.  Skin: Skin is warm and dry.  Psychiatric: He has a normal mood and affect. His behavior is normal.  Nursing note and vitals reviewed.   ED Course  Procedures (including critical care time)  DIAGNOSTIC STUDIES: Oxygen Saturation is 100% on RA, normal by  my interpretation.    COORDINATION OF CARE:  9:27 PM Will administer steroid shot and tylenol. Discussed treatment plan with pt at bedside and pt agreed to plan.   Labs Review Labs Reviewed  RAPID STREP SCREEN (NOT AT [facility])  CULTURE, GROUP A STREP [facility])    Imaging Review No results found. I have personally reviewed and evaluated these lab results as part of my medical decision-making.   EKG  Interpretation None      MDM   Final diagnoses:  Exudative pharyngitis   Patient with temperature of 100.0 with tonsillar exudate, cervical lymphadenopathy, & dysphagia; Strep negative but c/w diagnosis of strep. Treated in the ED with steroids. rx for amoxil. Pt appears mildly dehydrated, discussed importance of water rehydration. Presentation non concerning for PTA or infxn spread to soft tissue. No trismus or uvula deviation. Specific return precautions discussed. Pt able to drink water in ED without difficulty with intact air way.  Recommended PCP follow up.   I personally performed the services described in this documentation, which was scribed in my presence. The recorded information has been reviewed and is accurate.   [facility] Ward, PA-C 01/09/16 2254  Arby Barrette, MD 01/10/16 [phone]

## 2016-01-09 NOTE — ED Notes (Signed)
Pt ambulates independently and with steady gait at time of discharge. Discharge instructions and follow up information reviewed with patient. No other questions or concerns voiced at this time.  

## 2016-01-09 NOTE — ED Notes (Signed)
Pt reports drainage coming from L upper molars and now has sore throat.

## 2016-01-10 LAB — CULTURE, GROUP A STREP (THRC)

## 2016-05-01 IMAGING — CR DG HAND COMPLETE 3+V*R*
3 series · 3 of 3 positions shown · non-contrast
Comparison: None.

CLINICAL DATA: Pain and swelling in the right hand fifth metacarpal
after hitting a wall 2 days ago.

EXAM:
RIGHT HAND - COMPLETE 3+ VIEW

[hand pa]
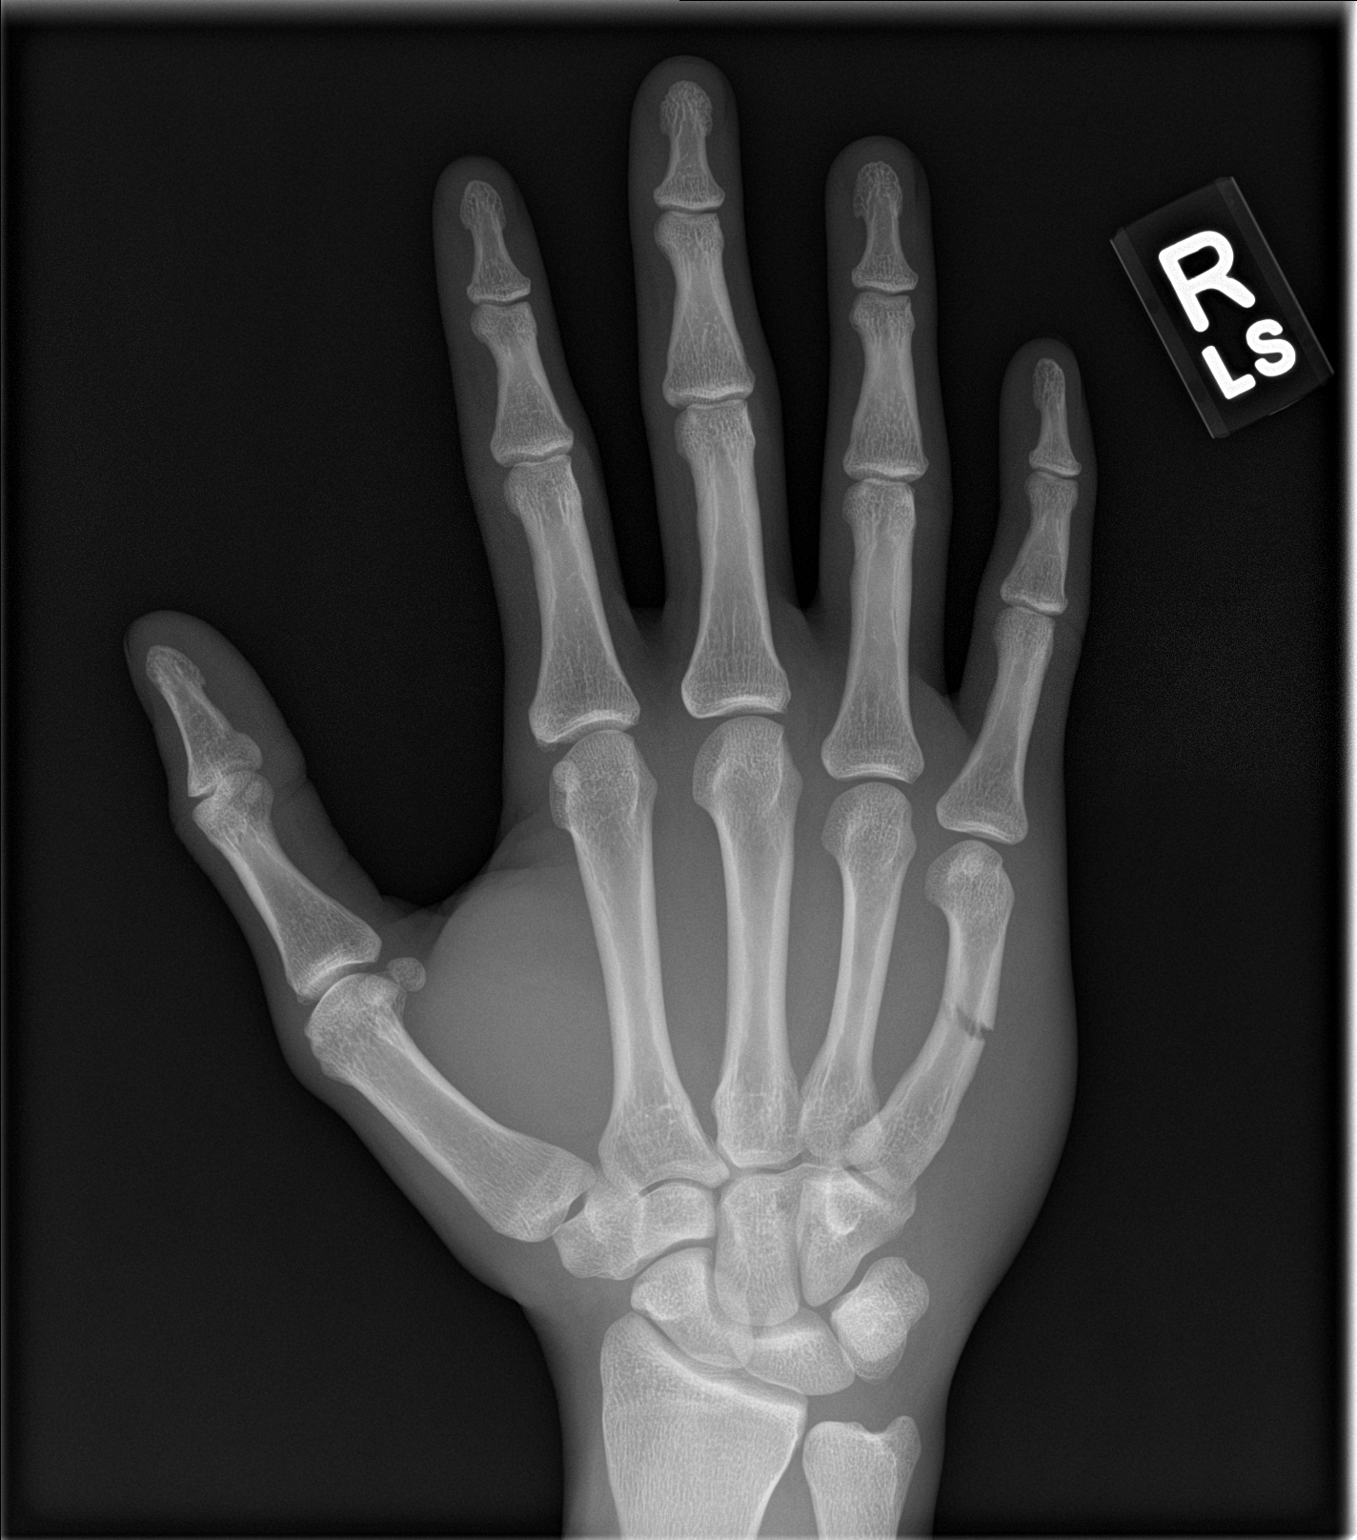

[hand obl]
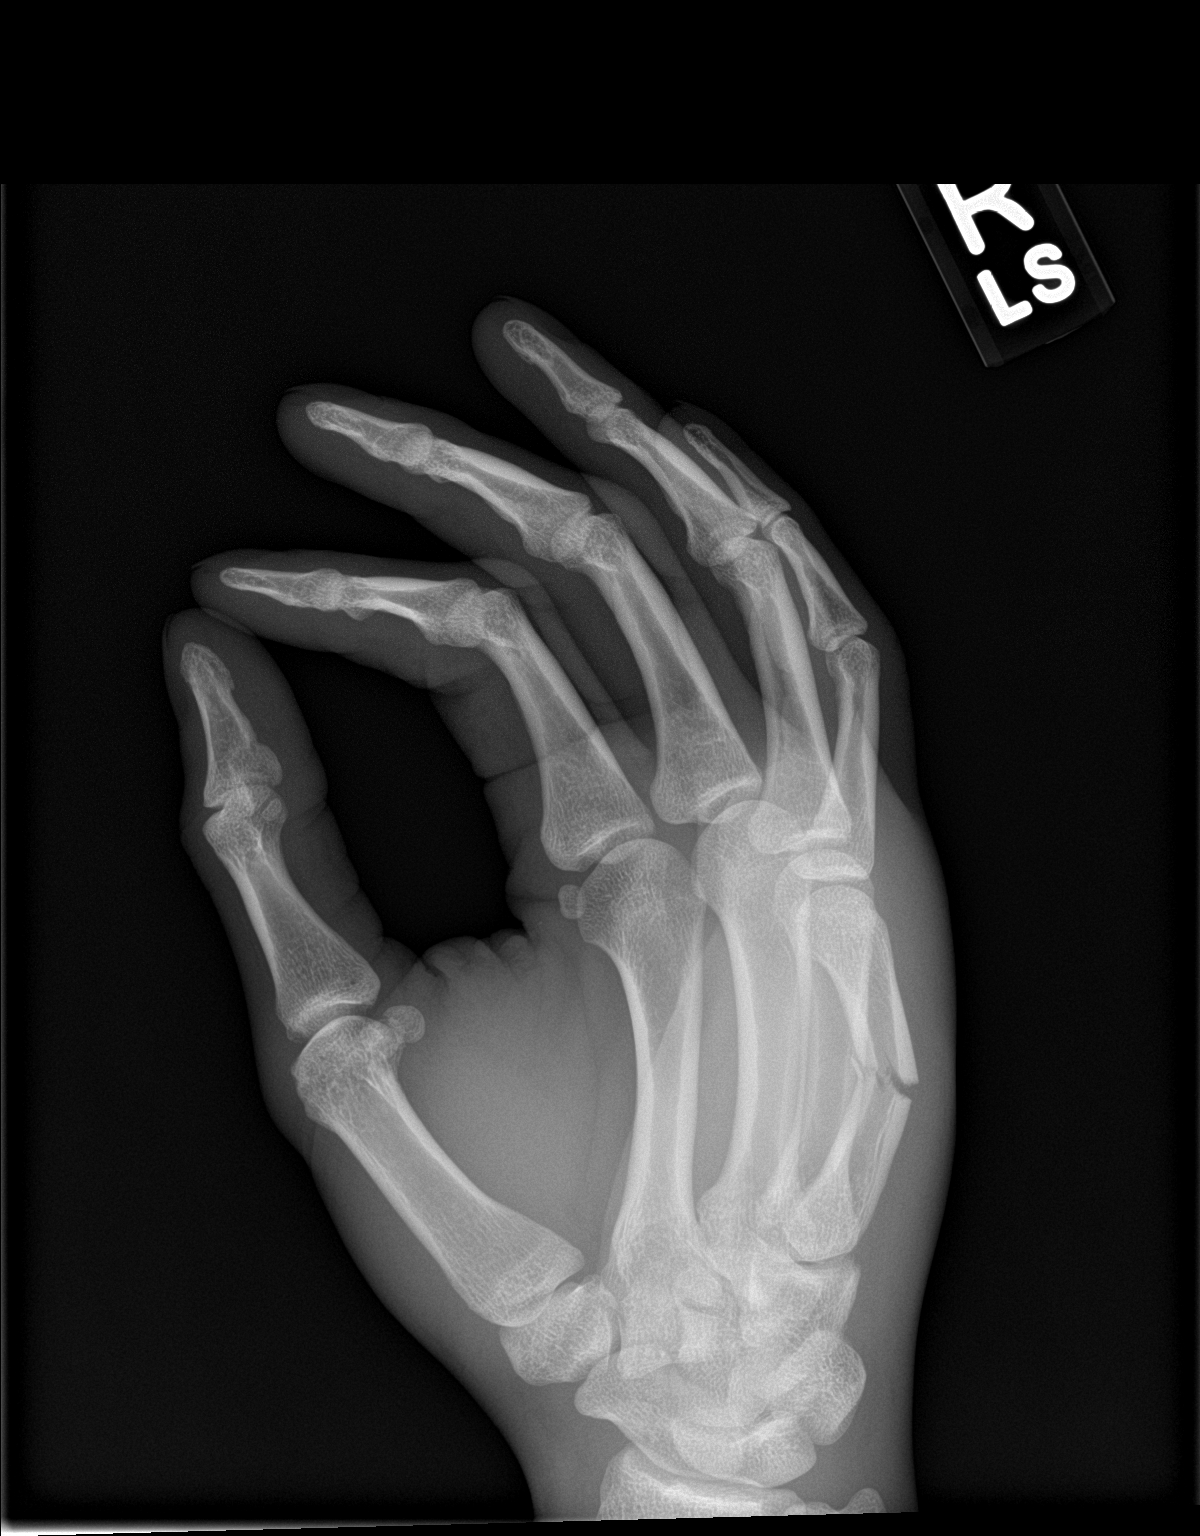

[hand lat]
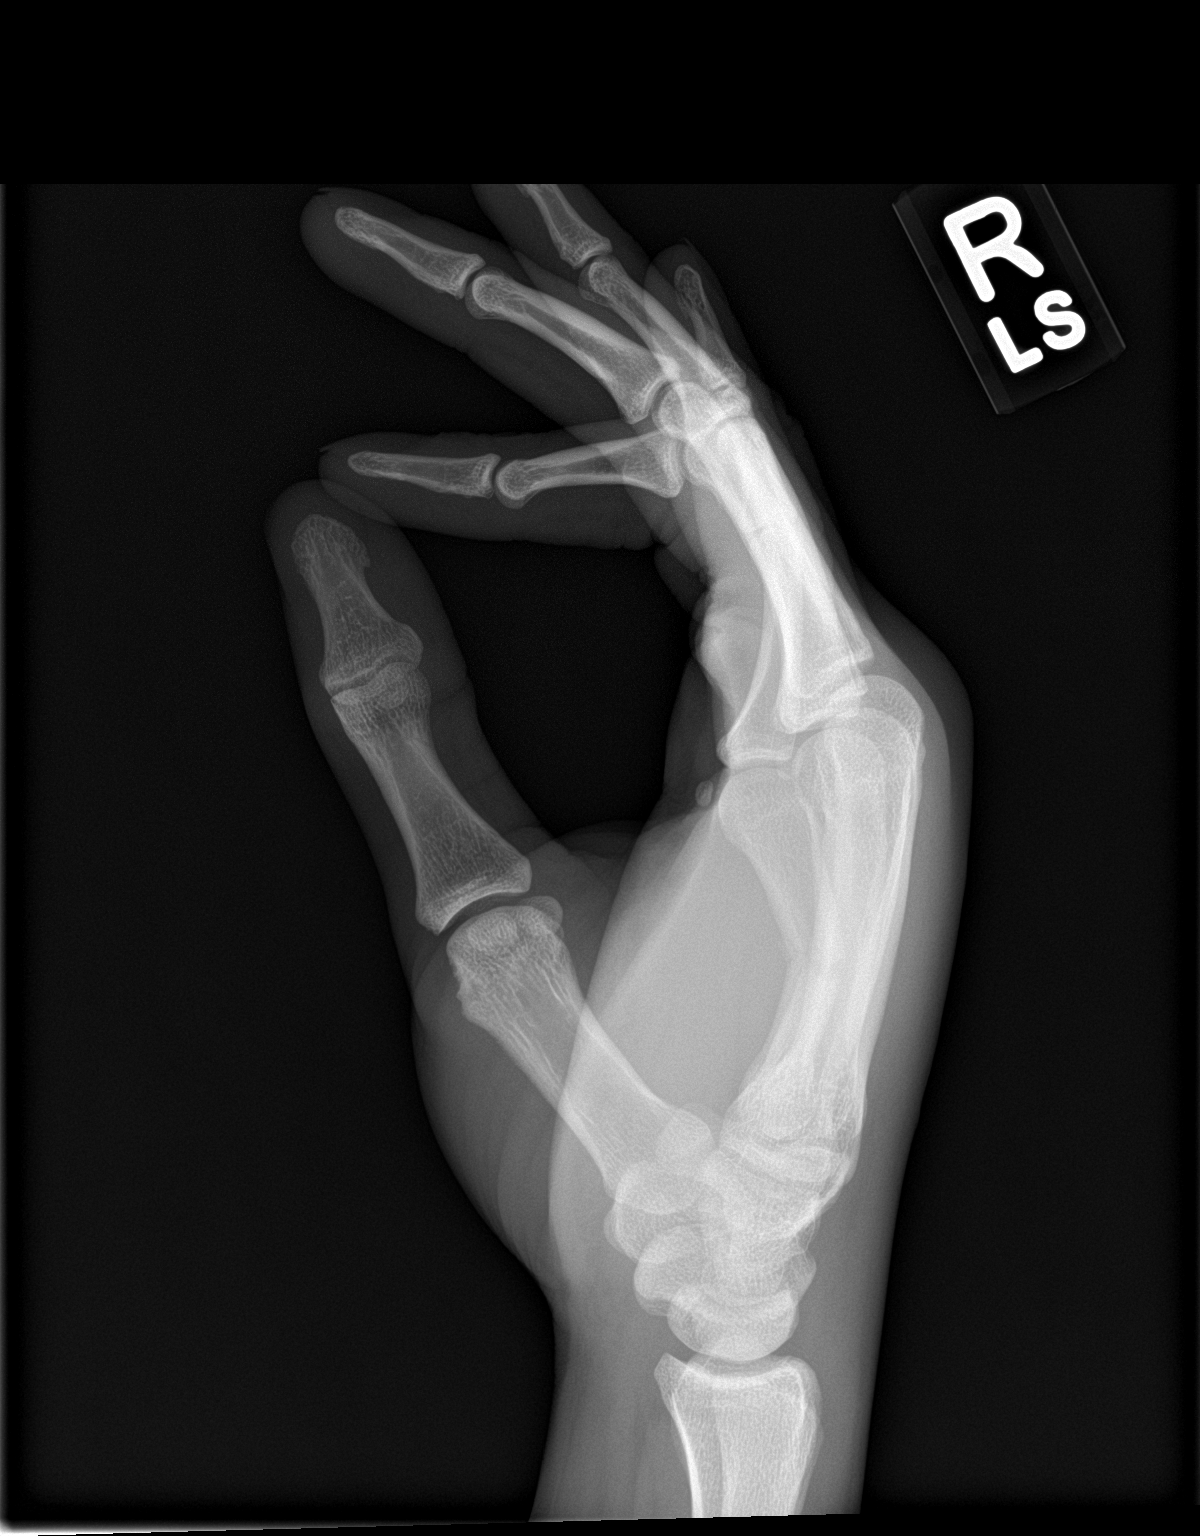

[3 of 3 positions shown; findings below may reference images not displayed]

FINDINGS: There is a transverse fracture associated with anterior angulation
of the mid fifth metacarpal. Associated soft tissue swelling. No
other acute fractures or subluxation.
IMPRESSION: 1. Fifth metacarpal fracture.
2. Significant soft tissue swelling.
